# Patient Record
Sex: Female | Born: 1985
Health system: Southern US, Community
[De-identification: ages and names within clinical notes are randomized; demographics above are authoritative.]

## PROBLEM LIST (undated history)

## (undated) HISTORY — PX: WISDOM TOOTH EXTRACTION: SHX21

---

## 2017-12-22 ENCOUNTER — Ambulatory Visit (INDEPENDENT_AMBULATORY_CARE_PROVIDER_SITE_OTHER): Payer: BLUE CROSS/BLUE SHIELD | Admitting: Medical

## 2017-12-22 ENCOUNTER — Encounter: Payer: Self-pay | Admitting: Medical

## 2017-12-22 VITALS — BP 122/62 | HR 66 | Temp 98.3°F | Ht 66.5 in | Wt 134.2 lb

## 2017-12-22 DIAGNOSIS — R05 Cough: Secondary | ICD-10-CM

## 2017-12-22 DIAGNOSIS — J3489 Other specified disorders of nose and nasal sinuses: Secondary | ICD-10-CM | POA: Diagnosis not present

## 2017-12-22 DIAGNOSIS — R059 Cough, unspecified: Secondary | ICD-10-CM

## 2017-12-22 DIAGNOSIS — J301 Allergic rhinitis due to pollen: Secondary | ICD-10-CM

## 2017-12-22 DIAGNOSIS — H9209 Otalgia, unspecified ear: Secondary | ICD-10-CM

## 2017-12-22 MED ORDER — AMOXICILLIN 875 MG PO TABS: 875.0000 mg | ORAL_TABLET | Freq: Two times a day (BID) | ORAL | 0 refills | Status: DC

## 2017-12-22 MED ORDER — BENZONATATE 200 MG PO CAPS: 200.0000 mg | ORAL_CAPSULE | Freq: Three times a day (TID) | ORAL | 0 refills | Status: DC | PRN

## 2017-12-22 NOTE — Patient Instructions (Signed)
Recommendations  Increase your water intake  Stop Claritin  Instead, begin either Zyrtec D or Allegra D daily for at least 10+ days  You can use Ibuprofen over the counter if needed for throat or ear pain or head pressure  You can use Tessalon Perles cough drops up to 3 times daily for cough suppression  Wash daily, consider nasal saline flush  If you have worsening symptoms in the next few days despite the above recommendation, for example ongoing ear pain, worse head pressure, fever, thicker green or yellow colored mucous, then begin course of Amoxicillin antibiotic    Using Saline Nose Drops with Bulb Syringe A bulb syringe is used to clear your nose. You may use it when you have a stuffy nose, nasal congestion, sinus pressure, or sneezing.   SALINE SOLUTION You can buy nose drops at your local drug store. You can also make nose drops yourself. Mix 1 cup of water with  teaspoon of salt. Stir. Store this mixture at room temperature. Make a new batch daily.  USE THE BULB IN COMBINATION WITH SALINE NOSE DROPS  Squeeze the air out of the bulb before suctioning the saline mixture.  While still squeezing the bulb flat, place the tip of the bulb into the saline mixture.  Let air come back into the bulb.  This will suction up the saline mixture.  Gently flush one nostril at a time.  Salt water nose drops will then moisten your  congested nose and loosen secretions before suctioning.  Use the bulb syringe as directed below to suction.  USING THE BULB SYRINGE TO SUCTION  While still squeezing the bulb flat, place the tip of the bulb into a nostril. Let air come back into the bulb. The suction will pull snot out of the nose and into the bulb.  Repeat on the other nostril.  Squeeze syringe several times into a tissue.  CLEANING THE BULB SYRINGE  Clean the bulb syringe every day with hot soapy water.  Clean the inside of the bulb by squeezing the bulb while the tip is in soapy  water.  Rinse by squeezing the bulb while the tip is in clean hot water.  Store the bulb with the tip side down on paper towel.  HOME CARE INSTRUCTIONS   Use saline nose drops often to keep the nose open and not stuffy.  Throw away used salt water. Make a new solution every time.  Do not use the same solution and dropper for another person  If you do not prefer to use nasal saline flush, other options include nasal saline spray or the [employer], both of which are available over the counter at your pharmacy.

## 2017-12-22 NOTE — Progress Notes (Signed)
Subjective: Chief Complaint  Patient presents with  . Allergies    ear pain,cough, throat drainge, phlemg    Here as a new patient.  She notes 15-16 days of symptoms.  Just developed a heavy cough.  She has had some ear pressure, hearing decreased, pain in right ear, recent mild sore throat, and bad cough last night.   symptoms improved in the day, but worse in the morning.  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   No fever.  Has felt some SOB.  No hx/o asthma, no hx/o allergy problems.  Has a dog and a cat.   Works at a [employer].  Non smoker.   Has tried some plain Claritin, and using some Theraflu at night.    Has lived in [place] since 2012, [place] prior.      No past medical history on file.  No current outpatient medications on file prior to visit.   No current facility-administered medications on file prior to visit.    ROS as in subjective   Objective: BP 122/62   Pulse 66   Temp 98.3 F (36.8 C) (Oral)   Ht 5' 6.5" (1.689 m)   Wt 134 lb 3.2 oz (60.9 kg)   SpO2 98%   BMI 21.34 kg/m   General appearance: alert, no distress, WD/WN, lean white female HEENT: normocephalic, mild conjunctiva injection, sclerae anicteric, TMs pearly, nares with mild intermittent turbinated edema, no discharge or erythema, pharynx with post nasal drainage Oral cavity: MMM, no lesions Neck: supple, no lymphadenopathy, no thyromegaly, no masses Heart: RRR, normal S1, S2, no murmurs Lungs: CTA bilaterally, no wheezes, rhonchi, or rales Pulses: 2+ symmetric, upper and lower extremities, normal cap refill No calve pain or asymmetry   Assessment: Encounter Diagnoses  Name Primary?  . Sinus pressure Yes  . Otalgia, unspecified laterality   . Allergic rhinitis due to pollen, unspecified seasonality   . Cough     Plan: Discussed concerns, symptoms.   May just be allergy related, but can't rule out early sinusitis.   Recommendations  Increase your water intake  Stop  Claritin  Instead, begin either Zyrtec D or Allegra D daily for at least 10+ days  You can use Ibuprofen over the counter if needed for throat or ear pain or head pressure  You can use Tessalon Perles cough drops up to 3 times daily for cough suppression  Wash daily, consider nasal saline flush  If you have worsening symptoms in the next few days despite the above recommendation, for example ongoing ear pain, worse head pressure, fever, thicker green or yellow colored mucous, then begin course of Amoxicillin antibiotic

## 2018-10-19 ENCOUNTER — Ambulatory Visit (INDEPENDENT_AMBULATORY_CARE_PROVIDER_SITE_OTHER): Payer: BLUE CROSS/BLUE SHIELD | Admitting: Medical

## 2018-10-19 ENCOUNTER — Encounter: Payer: Self-pay | Admitting: Medical

## 2018-10-19 VITALS — BP 110/66 | HR 60 | Temp 97.9°F | Resp 16 | Ht 68.0 in | Wt 137.4 lb

## 2018-10-19 DIAGNOSIS — W57XXXA Bitten or stung by nonvenomous insect and other nonvenomous arthropods, initial encounter: Secondary | ICD-10-CM

## 2018-10-19 DIAGNOSIS — R21 Rash and other nonspecific skin eruption: Secondary | ICD-10-CM | POA: Diagnosis not present

## 2018-10-19 DIAGNOSIS — L989 Disorder of the skin and subcutaneous tissue, unspecified: Secondary | ICD-10-CM | POA: Insufficient documentation

## 2018-10-19 MED ORDER — TRIAMCINOLONE ACETONIDE 0.1 % EX CREA: 1.0000 "application " | TOPICAL_CREAM | Freq: Two times a day (BID) | CUTANEOUS | 0 refills | Status: DC

## 2018-10-19 NOTE — Progress Notes (Signed)
  Subjective:     Patient ID: Christine Ortiz, female   DOB: 1986/02/16, 33 y.o.   MRN: [id number]  HPI Here for rash on the back of her right leg behind the knee.  She was bitten by a tick back in October 2019, removed the tick, never got sick, never had a target lesion, never had maculopapular rash, denies body aches, chills, fever, denies joint aches.  Feeling fine.  Her only concern is this red bump where the tick bit her has not completely resolved and he gets itchy from time to time particularly if she uses a heated blanket such as in the wintertime. No other aggravating or relieving factors. No other complaint.   Review of Systems     Objective:   Physical Exam  General: Well-developed well-nourished no acute distress, white female Behind the right knee in the popliteal area is a 3 mm raised papular erythematous lesion, not indurated, not warm, no other worrisome findings No obvious lymphadenopathy or other skin changes    Assessment:     Encounter Diagnoses  Name Primary?  . Tick bite, initial encounter Yes  . Rash        Plan:     We discussed her symptoms, exam findings, and no red flag issues today.  Begin cream below for the next 7 to 10 days.  If not completely resolved within 10 days let me know.  Reassured that no worrisome findings  Advise she return soon for physical and fasting labs for routine preventative care  Doy was seen today for tick bite.  Diagnoses and all orders for this visit:  Tick bite, initial encounter  Rash  Other orders -     triamcinolone cream (KENALOG) 0.1 %; Apply 1 application topically 2 (two) times daily.

## 2018-10-19 NOTE — Progress Notes (Signed)
error 

## 2019-04-08 LAB — HM PAP SMEAR: HM Pap smear: NEGATIVE

## 2019-06-09 ENCOUNTER — Other Ambulatory Visit: Payer: Self-pay

## 2019-06-09 ENCOUNTER — Encounter: Payer: Self-pay | Admitting: Medical

## 2019-06-09 ENCOUNTER — Ambulatory Visit (INDEPENDENT_AMBULATORY_CARE_PROVIDER_SITE_OTHER): Payer: BC Managed Care – PPO | Admitting: Medical

## 2019-06-09 VITALS — BP 120/72 | HR 61 | Temp 97.8°F | Ht 68.0 in | Wt 137.4 lb

## 2019-06-09 DIAGNOSIS — R196 Halitosis: Secondary | ICD-10-CM | POA: Diagnosis not present

## 2019-06-09 DIAGNOSIS — J3489 Other specified disorders of nose and nasal sinuses: Secondary | ICD-10-CM | POA: Diagnosis not present

## 2019-06-09 DIAGNOSIS — Z0000 Encounter for general adult medical examination without abnormal findings: Secondary | ICD-10-CM | POA: Diagnosis not present

## 2019-06-09 DIAGNOSIS — Z113 Encounter for screening for infections with a predominantly sexual mode of transmission: Secondary | ICD-10-CM | POA: Insufficient documentation

## 2019-06-09 DIAGNOSIS — F339 Major depressive disorder, recurrent, unspecified: Secondary | ICD-10-CM | POA: Insufficient documentation

## 2019-06-09 DIAGNOSIS — Z23 Encounter for immunization: Secondary | ICD-10-CM | POA: Diagnosis not present

## 2019-06-09 DIAGNOSIS — H9203 Otalgia, bilateral: Secondary | ICD-10-CM | POA: Diagnosis not present

## 2019-06-09 DIAGNOSIS — L989 Disorder of the skin and subcutaneous tissue, unspecified: Secondary | ICD-10-CM

## 2019-06-09 DIAGNOSIS — Z638 Other specified problems related to primary support group: Secondary | ICD-10-CM

## 2019-06-09 MED ORDER — AMOXICILLIN 875 MG PO TABS: 875.0000 mg | ORAL_TABLET | Freq: Two times a day (BID) | ORAL | 0 refills | Status: DC

## 2019-06-09 MED ORDER — FLUOXETINE HCL 20 MG PO TABS: 20.0000 mg | ORAL_TABLET | Freq: Every day | ORAL | 1 refills | Status: DC

## 2019-06-09 NOTE — Patient Instructions (Signed)
RESOURCES in [place], [place]  If you are experiencing a mental health crisis or an emergency, please call 911 or go to the nearest emergency department.  [facility]   [phone] [facility]  [phone] [facility]   [phone]  Suicide Hotline 1-800-Suicide (1-800-784-2433)  National Suicide Prevention Lifeline 1-800-273-TALK  (1-800-273-8255)  Domestic Violence, Rape/Crisis - [facility] 336-273-7273  The National Domestic Violence Hotline 1-800-799-SAFE (1-800-799-7233)  To report Child or Elder Abuse, please call: [place] Police Department  [phone] [place] Sherriff Department  [phone]  LGBT Youth Crisis Line 1-866-488-7386  Teen Crisis line 336-387-6161 or 1-877-332-7333     Psychiatry and Counseling services  [facility] [address], [place], [place] [address] [phone]  Holly Ingram, therapist Dr. Carey Cottle, psychiatrist Dr. Glenn Jennings, child psychiatrist   Dr. Bartosz Luginbill Fuller [address], [place], [place] [address] [phone]   Dr. Rupinder Kaur, psychiatry [address], [place], [place] [address] [phone]   [facility] [address], [place], [place] [address] [phone]   [facility] [address], [place], [place] [address] [phone]    Counseling Services (NON- psychiatrist offices)  [facility] [address], [place], [place] [address] [phone]   [facility] [phone] [address], [place], [place] [address]   [facility] [phone]  [email or web address] [address], [place], [place] [address]   Merrianne M. Leff, therapist [phone] [address] [place], [place] [address]   [facility] [phone] [address], [place], [place]  [address]   Jill White-Huffman, therapist [phone] [address], [place], [place] [address]   [facility] [phone] [address], [place], [place] [address]   

## 2019-06-09 NOTE — Progress Notes (Signed)
Subjective:   HPI  Christine Ortiz is a 33 y.o. female who presents for Chief Complaint  Patient presents with  . Annual Exam    Patient Care Team: Tysinger, Camelia Eng, PA-C as PCP - General (Family Medicine) Sees dentist Sees eye doctor Dr. Genia Harold, [facility] Was seeing therapist in the past  Concerns: Hasn't had physical in numerous years other than yearly gyn follow up.  She notes some bilat ear pain for a month.  Worse after cleaning ears with qtips.  No drainage. Sometimes has allergies, but mild sinus pressure.  Has hx/o lots of ear infections as a child  Wants flu shot, td vaccine.  Has mole on left breast she is concerned about  Having a breath issue in past year, tonsil stones.  Has concerns about depression, fatigue. This year has gotten a lot worse. Started before she got married.  Been a roller coaster of a year.     Depression has been a theme since adolescence.   Has had some highs/lows.  Sometimes slumps quite low, but then will rebound.  Can go periods of things seemingly ok, not just ecstatic and no hx/o mania, but can then revert into period of depression.   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    Has some work stress  One of her major stressors is poor relationship in family since adolescence.   Her parents divorced when she was 65yo.   She to this day can't seem to have a good relationship with her father.   Her PGM blames her for not nurturing this relationship although father is an alcoholic.   Her mother and her had a recent big problem when she was planning her wedding.  Got married this past year.   Thus, she doesn't have good relationships with parents and even some grandparents.  Her best person of encouragement as a child was a friend's mom.    Her brother who is 65yo also doesn't get along with their parents. Mom started medication for ADD recently but she knows of no other mental health  issues in the family.   She was seeing recent therapist for a few months but insurance caused change in this.  She has seen therapies in the past as well.   No prior medication.  No other aggravating or relieving factors. No other complaint.   Reviewed their medical, surgical, family, social, medication, and allergy history and updated chart as appropriate.  History reviewed. No pertinent past medical history.  Past Surgical History:  Procedure Laterality Date  . WISDOM TOOTH EXTRACTION      Social History   Socioeconomic History  . Marital status: Single    Spouse name: Not on file  . Number of children: Not on file  . Years of education: Not on file  . Highest education level: Not on file  Occupational History  . Not on file  Social Needs  . Financial resource strain: Not on file  . Food insecurity    Worry: Not on file    Inability: Not on file  . Transportation needs    Medical: Not on file    Non-medical: Not on file  Tobacco Use  . Smoking status: Former Smoker    Packs/day: 0.25    Years: 10.00    Pack years: 2.50    Quit date: 02/07/2012    Years since quitting: 7.3  . Smokeless tobacco: Never Used  Substance and Sexual Activity  . Alcohol use: Yes    Comment: rarely  . Drug use: Never  . Sexual activity: Not on file  Lifestyle  . Physical activity    Days per week: Not on file    Minutes per session: Not on file  . Stress: Not on file  Relationships  . Social Musician on phone: Not on file    Gets together: Not on file    Attends religious service: Not on file    Active member of club or organization: Not on file    Attends meetings of clubs or organizations: Not on file    Relationship status: Not on file  . Intimate partner violence    Fear of current or ex partner: Not on file    Emotionally abused: Not on file    Physically abused: Not on file    Forced sexual activity: Not on file  Other Topics Concern  . Not on file  Social  History Narrative   Lives with husband, 2 dogs and a cat.   Working a Engineer, technical sales clinic, [employer].   Dog walking for exercise     Family History  Problem Relation Age of Onset  . Stroke Maternal Grandmother   . Cancer Maternal Grandfather        lung, smoker  . Heart disease Neg Hx   . Diabetes Neg Hx   . Hypertension Neg Hx      Current Outpatient Medications:  .  amoxicillin (AMOXIL) 875 MG tablet, Take 1 tablet (875 mg total) by mouth 2 (two) times daily., Disp: 20 tablet, Rfl: 0 .  FLUoxetine (PROZAC) 20 MG tablet, Take 1 tablet (20 mg total) by mouth daily., Disp: 30 tablet, Rfl: 1 .  triamcinolone cream (KENALOG) 0.1 %, Apply 1 application topically 2 (two) times daily. (Patient not taking: Reported on 06/09/2019), Disp: 30 g, Rfl: 0  Allergies  Allergen Reactions  . Rosemary Oil     Itching and throat tightness      Review of Systems Constitutional: -fever, -chills, -sweats, -unexpected weight change, -decreased appetite, -fatigue Allergy: -sneezing, -itching, -congestion Dermatology: +changing moles, --rash, -lumps ENT: -runny nose, +ear pain, -sore throat, -hoarseness, +sinus pain, -teeth pain, - ringing in ears, -hearing loss, -nosebleeds Cardiology: -chest pain, -palpitations, -swelling, -difficulty breathing when lying flat, -waking up short of breath Respiratory: -cough, -shortness of breath, -difficulty breathing with exercise or exertion, -wheezing, -coughing up blood Gastroenterology: -abdominal pain, -nausea, -vomiting, -diarrhea, -constipation, -blood in stool, -changes in bowel movement, -difficulty swallowing or eating Hematology: -bleeding, -bruising  Musculoskeletal: -joint aches, -muscle aches, -joint swelling, -back pain, -neck pain, -cramping, -changes in gait Ophthalmology: denies vision changes, eye redness, itching, discharge Urology: -burning with urination, -difficulty urinating, -blood in urine, -urinary frequency, -urgency,  -incontinence Neurology: -headache, -weakness, -tingling, -numbness, -memory loss, -falls, -dizziness Psychology: +depressed mood, -agitation, -sleep problems Breast/gyn: -breast tenderness, -discharge, -lumps, -vaginal discharge,- irregular periods, -heavy periods     Objective:  BP 120/72   Pulse 61   Temp 97.8 F (36.6 C)   Ht 5\' 8"  (1.727 m)   Wt 137 lb 6.4 oz (62.3 kg)   SpO2 99%   BMI 20.89 kg/m   General appearance: alert, no distress, WD/WN, Caucasian female Skin: scattered macules.  Left lower back with 4-5 mm diameter brown lesion with somewhat smudged borders, left medial breast at 8 o'clock with somewhat rectangular 0.5 cm x 0.47m brownish flat lesion, changes in the  last 2 months per patient.   other benign appearing macules.  HEENT: normocephalic, conjunctiva/corneas normal, sclerae anicteric, PERRLA, EOMi, nares patent, no discharge or erythema, pharynx normal Oral cavity: MMM, tongue normal, teeth normal, in good repair Neck: supple, no lymphadenopathy, no thyromegaly, no masses, normal ROM, no bruits Chest: non tender, normal shape and expansion Heart: RRR, normal S1, S2, no murmurs Lungs: CTA bilaterally, no wheezes, rhonchi, or rales Abdomen: +bs, soft, non tender, non distended, no masses, no hepatomegaly, no splenomegaly, no bruits Back: non tender, normal ROM, no scoliosis Musculoskeletal: upper extremities non tender, no obvious deformity, normal ROM throughout, lower extremities non tender, no obvious deformity, normal ROM throughout Extremities: no edema, no cyanosis, no clubbing Pulses: 2+ symmetric, upper and lower extremities, normal cap refill Neurological: alert, oriented x 3, CN2-12 intact, strength normal upper extremities and lower extremities, sensation normal throughout, DTRs 2+ throughout, no cerebellar signs, gait normal Psychiatric: normal affect, behavior normal, pleasant  Breast/gyn/rectal - deferred to gynecology     Assessment and Plan :    Encounter Diagnoses  Name Primary?  . Encounter for health maintenance examination in adult Yes  . Otalgia of both ears   . Sinus pressure   . Skin lesion   . Halitosis   . Depression, recurrent (HCC)   . Stress due to family tension   . Screen for STD (sexually transmitted disease)      Physical exam - discussed and counseled on healthy lifestyle, diet, exercise, preventative care, vaccinations, sick and well care, proper use of emergency dept and after hours care, and addressed their concerns.    Health screening: Advised they see their eye doctor yearly for routine vision care. Advised they see their dentist yearly for routine dental care including hygiene visits twice yearly.  Discussed STD testing   Cancer screening Counseled on self breast exams, mammograms, cervical cancer screening  Vaccinations: Advised yearly influenza vaccine Counseled on the influenza virus vaccine.  Vaccine information sheet given.  Influenza vaccine given after consent obtained.  Counseled on the Tdap (tetanus, diptheria, and acellular pertussis) vaccine.  Vaccine information sheet given. Tdap vaccine given after consent obtained.   Separate significant issues discussed: Her main issue today is what seems to be pretty severe depression.  I advise she get a counseling right away.  She was seeing a counselor regular until her insurance forced to change in providers.  I gave her a list of the area.  She is agreeable to medication so we will start trial of fluoxetine.  We discussed risk and benefits of medication proper use of medication.  We discussed ways to move forward and deal with some of the issues she discussed.  I advised to follow-up in 2 to 3 weeks with me.  Ear pain, possibly related to eustachian tube dysfunction/sinus infection.  Begin oral amoxicillin and see if this clears it up as there is no other obvious cause for her ear pain today  Halitosis-advise she get in with a dentist soon  as she has not seen a dentist in 3 years.  Also discussed acid reflux and sinus infection and other things that can contribute to bad breath.  She does eat a lot of spicy foods and we discussed possibly cutting back for now to see if this helps her symptoms  She will return next week for fasting labs   Christine LeatherwoodKatherine was seen today for annual exam.  Diagnoses and all orders for this visit:  Encounter for health maintenance examination in adult -  Comprehensive metabolic panel; Future -     CBC with Differential/Platelet; Future -     Lipid panel; Future -     TSH; Future -     HIV Antibody (routine testing w rflx); Future -     RPR; Future  Otalgia of both ears  Sinus pressure  Skin lesion -     Ambulatory referral to Dermatology  Halitosis  Depression, recurrent (HCC)  Stress due to family tension  Screen for STD (sexually transmitted disease) -     HIV Antibody (routine testing w rflx); Future -     RPR; Future  Other orders -     amoxicillin (AMOXIL) 875 MG tablet; Take 1 tablet (875 mg total) by mouth 2 (two) times daily. -     FLUoxetine (PROZAC) 20 MG tablet; Take 1 tablet (20 mg total) by mouth daily.   Spent greater than 45 minutes specifically on depression and her mood.  Follow-up pending labs, yearly for physical

## 2019-06-13 NOTE — Addendum Note (Signed)
Addended by: Edgar Frisk on: 06/13/2019 10:55 AM   Modules accepted: Orders

## 2019-06-17 ENCOUNTER — Telehealth: Payer: Self-pay | Admitting: Medical

## 2019-06-17 NOTE — Telephone Encounter (Signed)
Received requested records from [facility] 

## 2019-06-21 ENCOUNTER — Encounter: Payer: Self-pay | Admitting: Medical

## 2019-07-01 ENCOUNTER — Telehealth: Payer: Self-pay

## 2019-07-01 NOTE — Telephone Encounter (Signed)
Pt. Called questions about new medication that was started recently Fluoxetine 20 mg 1 daily, at first was taking in the morning mood was good but then after 3 days started waking up in the middle of night, so then switched to taking QHS slept better then but mood was worse through out the day, pt. Just wanted to know when she should take the medication, or if she also needed something for sleep or did the dose need to be increased. Pt. Aware we are closing soon and you would not get back to her today.

## 2019-07-04 ENCOUNTER — Encounter: Payer: Self-pay | Admitting: Medical

## 2019-07-04 ENCOUNTER — Other Ambulatory Visit: Payer: Self-pay

## 2019-07-04 ENCOUNTER — Ambulatory Visit (INDEPENDENT_AMBULATORY_CARE_PROVIDER_SITE_OTHER): Payer: BC Managed Care – PPO | Admitting: Medical

## 2019-07-04 VITALS — Ht 67.0 in | Wt 137.0 lb

## 2019-07-04 DIAGNOSIS — G479 Sleep disorder, unspecified: Secondary | ICD-10-CM | POA: Diagnosis not present

## 2019-07-04 DIAGNOSIS — Z638 Other specified problems related to primary support group: Secondary | ICD-10-CM | POA: Diagnosis not present

## 2019-07-04 DIAGNOSIS — F339 Major depressive disorder, recurrent, unspecified: Secondary | ICD-10-CM

## 2019-07-04 MED ORDER — FLUOXETINE HCL 10 MG PO CAPS: 10.0000 mg | ORAL_CAPSULE | Freq: Every day | ORAL | 1 refills | Status: DC

## 2019-07-04 NOTE — Telephone Encounter (Signed)
I called and left message.   Go ahead and set up for virtual consult today.

## 2019-07-04 NOTE — Telephone Encounter (Signed)
Called LM with pt. To call back for a virtual apt.

## 2019-07-04 NOTE — Progress Notes (Signed)
this visit type was conducted due to national recommendations for restrictions regarding the COVID-19 Pandemic (e.g. social distancing) in an effort to limit this patient's exposure and mitigate transmission in our community.  Due to their co-morbid illnesses, this patient is at least at moderate risk for complications without adequate follow up.  This format is felt to be most appropriate for this patient at this time.    Documentation for virtual audio and video telecommunications through Zoom encounter:  The patient was located at home. The provider was located in the office. The patient did consent to this visit and is aware of possible charges through their insurance for this visit.  The other persons participating in this telemedicine service were none. Time spent on call was  15 minutes and in review of previous records > 15 minutes total.  This virtual service is not related to other E/M service within previous 7 days.  Subjective: Chief Complaint  Patient presents with  . Consult   Virtual consult today to follow-up on medication started last visit her physical.  Last visit we started fluoxetine 20 mg to help with longstanding depression and mood issues.  So far even in the first week she felt immediate improvements in mood and depression.  However when she takes the medication in the day her mood is good but she does not sleep very well.  And after several days on it it from a friend she started taking the medication at bedtime.  Changing the medication to bedtime to help with sleep but then it did not help as much with her mood.  She notices significant difference.  She changed the medication back to morning time dosing.  Overall being on this medication she feels like she has better control over her emotions.  She feels like she compares to controlling a horse versus a horse trampling over her.  Since last visit on the medication, she is not getting is irritable or stressed over  things that make her more stressed.  Her friends and her husband have taken notice of this.  She can tell that she is more focused, handling things better overall compared to a month ago.  She has her new appointment with counseling coming up soon.  She was seeing a different counselor prior until her insurance changed.  Since last visit she also has been doing more group activity, more social which has also been helping.  Since last visit she saw dermatology and had a biopsy of the skin lesion of her left breast.  No other new complaints.  See last visit for further details on history regarding depression.    Objective: Ht 5\' 7"  (1.702 m)   Wt 137 lb (62.1 kg)   BMI 21.46 kg/m   Not examined in person as this was a virtual consult  Assessment: Encounter Diagnoses  Name Primary?  . Depression, recurrent ([place]) Yes  . Stress due to family tension   . Sleep disturbance      Plan: Glad to hear she sees improvements in mood.  We will decrease dose of fluoxetine to see if this helps both sleep and continues to help with mood.  Continue plan to establish with a new counselor.  I asked her to follow-up within 1 to 2 weeks to give me an update on symptoms.  Christine Ortiz was seen today for consult.  Diagnoses and all orders for this visit:  Depression, recurrent ([place])  Stress due to family tension  Sleep disturbance  Other orders -  FLUoxetine (PROZAC) 10 MG capsule; Take 1 capsule (10 mg total) by mouth daily.

## 2019-08-05 ENCOUNTER — Encounter: Payer: Self-pay | Admitting: Medical

## 2019-08-05 ENCOUNTER — Other Ambulatory Visit: Payer: Self-pay

## 2019-08-22 ENCOUNTER — Other Ambulatory Visit: Payer: Self-pay | Admitting: Medical

## 2019-11-24 ENCOUNTER — Other Ambulatory Visit: Payer: Self-pay | Admitting: Medical

## 2019-11-24 NOTE — Telephone Encounter (Signed)
Sent patient a message requesting a call for appointment.

## 2019-11-30 ENCOUNTER — Other Ambulatory Visit: Payer: Self-pay

## 2019-11-30 ENCOUNTER — Ambulatory Visit (INDEPENDENT_AMBULATORY_CARE_PROVIDER_SITE_OTHER): Payer: BC Managed Care – PPO | Admitting: Medical

## 2019-11-30 ENCOUNTER — Encounter: Payer: Self-pay | Admitting: Medical

## 2019-11-30 VITALS — Temp 97.8°F | Ht 68.0 in | Wt 134.0 lb

## 2019-11-30 DIAGNOSIS — F325 Major depressive disorder, single episode, in full remission: Secondary | ICD-10-CM

## 2019-11-30 MED ORDER — FLUOXETINE HCL 20 MG PO CAPS: ORAL_CAPSULE | ORAL | 1 refills | Status: DC

## 2019-11-30 NOTE — Progress Notes (Signed)
  this visit type was conducted due to national recommendations for restrictions regarding the COVID-19 Pandemic (e.g. social distancing) in an effort to limit this patient's exposure and mitigate transmission in our community.  Due to their co-morbid illnesses, this patient is at least at moderate risk for complications without adequate follow up.  This format is felt to be most appropriate for this patient at this time.    Documentation for virtual audio and video telecommunications through Zoom encounter:  The patient was located at home. The provider was located in the office. The patient did consent to this visit and is aware of possible charges through their insurance for this visit.  The other persons participating in this telemedicine service were none. Time spent on call was  15 minutes and in review of previous records > 15 minutes total.  This virtual service is not related to other E/M service within previous 7 days.   Subjective: Chief Complaint  Patient presents with  . Follow-up    prozac-medication working fine per patient    Virtual consult today to follow-up on medication for mood.  She notes that her mood has been doing well.  Seeing a therapist every 2 weeks, Christine Ortiz.  We will be stopping counseling at this frequency soon and going to a less frequent sessions.   When she first started this medicine last year she felt like her emotions were like a horse cramping over her.  But now she feels like she has reigns on the horse.  The medication has been great to help stabilize her moods.  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  Is the best job she is ever had.  Otherwise doing well with no other particular complaints.   Sleep is ok.  Occasional wakes randomly.       Objective: Temp 97.8 F (36.6 C)   Ht 5\' 8"  (1.727 m)   Wt 134 lb (60.8 kg)   BMI 20.37 kg/m   Not examined in  person as this was a virtual consult     Assessment: Encounter Diagnosis  Name Primary?  . Depression, major, in remission ([facility]) Yes     Plan: Glad to hear she is doing much better and things have been going well for her.  She will continue counseling on a less frequent basis and every 2 weeks at this point.  She has a good stable job that she enjoys.  Discussed medication proper use.  Continue current medication.  Christine Ortiz was seen today for follow-up.  Diagnoses and all orders for this visit:  Depression, major, in remission (H[facility])  Other orders -     FLUoxetine (PROZAC) 20 MG capsule; TAKE 1 TABLET(20 MG) BY MOUTH DAILY  Follow-up in October for yearly physical

## 2019-12-07 ENCOUNTER — Other Ambulatory Visit: Payer: BC Managed Care – PPO

## 2019-12-21 ENCOUNTER — Other Ambulatory Visit: Payer: Self-pay

## 2019-12-21 ENCOUNTER — Encounter: Payer: Self-pay | Admitting: Medical

## 2019-12-21 ENCOUNTER — Ambulatory Visit (INDEPENDENT_AMBULATORY_CARE_PROVIDER_SITE_OTHER): Payer: BC Managed Care – PPO | Admitting: Medical

## 2019-12-21 VITALS — BP 120/78 | HR 68 | Temp 98.1°F | Ht 68.0 in | Wt 141.6 lb

## 2019-12-21 DIAGNOSIS — S93601A Unspecified sprain of right foot, initial encounter: Secondary | ICD-10-CM

## 2019-12-21 DIAGNOSIS — M79671 Pain in right foot: Secondary | ICD-10-CM | POA: Diagnosis not present

## 2019-12-21 DIAGNOSIS — M7741 Metatarsalgia, right foot: Secondary | ICD-10-CM | POA: Diagnosis not present

## 2019-12-21 NOTE — Patient Instructions (Signed)
Recommendations:   Use a combination of ice and heat therapy such as ice water bath in bucket for 10-15 minutes.   Then let foot warm up.   Then use warm water bath or hot towel for 10-15 minutes.  You can do heat/cool therapy twice daily  Begin Aleve over the counter, 2 tablets twice daily (440mg ) with food for at least 5 days.  Rest the foot when possible  Go get a cam walker or air cast boot from pharmacy to use daily for 10-14 days  Lets recheck in 10-14 days     [facility] [address] [place], [place], [place] [phone] phone

## 2019-12-21 NOTE — Progress Notes (Signed)
Chief Complaint  Patient presents with  . Foot Pain    right after mowing the lawn 2 weeks ago-worsened since then    Here for right foot pain.  She was fine until 2 weeks ago after mowing.  Her yard is almost 3 acres.   Recently she push mowed the bulk of the yard for about 2+ hours, doing a lot of pivoting and turning around trees, and was fine the evening of mowing.  However, the next morning awoke with right foot pain in ball of the foot, and along 4th and 5th toes along side of foot.   Denies other injury trauma, or fall.     Has pain with weight bearing primarily.   No swelling.   No bruising.    Has tried ice, heat, aleve a few times.  No other aggravating or relieving factors. No other complaint.  No past medical history on file.  Current Outpatient Medications on File Prior to Visit  Medication Sig Dispense Refill  . FLUoxetine (PROZAC) 20 MG capsule TAKE 1 TABLET(20 MG) BY MOUTH DAILY 90 capsule 1   No current facility-administered medications on file prior to visit.   ROS as in subjective    Objective: BP 120/78   Pulse 68   Temp 98.1 F (36.7 C)   Ht 5\' 8"  (1.727 m)   Wt 141 lb 9.6 oz (64.2 kg)   SpO2 98%   BMI 21.53 kg/m   Gen: wd, wn, nad Right foot without obvious deformity, normal toe and ankle range of motion, nontender to palpation, but she notes pain when standing on the foot and walking.  Arches are relatively good.  Knee and hip nontender, rest of leg nontender, normal range of motion     Assessment: Encounter Diagnoses  Name Primary?  . Right foot pain Yes  . Metatarsalgia of right foot   . Sprain of foot joint, right, initial encounter       Plan: Symptoms and exam suggests sprain strain injury from overuse from mowing and doing a lot of pivoting.  We discussed the following recommendations  Patient Instructions  Recommendations:   Use a combination of ice and heat therapy such as ice water bath in bucket for 10-15 minutes.   Then let foot  warm up.   Then use warm water bath or hot towel for 10-15 minutes.  You can do heat/cool therapy twice daily  Begin Aleve over the counter, 2 tablets twice daily (440mg ) with food for at least 5 days.  Rest the foot when possible  Go get a cam walker or air cast boot from pharmacy to use daily for 10-14 days  Lets recheck in 10-14 days     [facility] [address] [place], [address] [place] [phone] phone    Sheridyn was seen today for foot pain.  Diagnoses and all orders for this visit:  Right foot pain  Metatarsalgia of right foot  Sprain of foot joint, right, initial encounter   Follow-up in 10 to 14 days

## 2020-01-02 ENCOUNTER — Ambulatory Visit: Payer: BC Managed Care – PPO | Admitting: Medical

## 2020-01-09 ENCOUNTER — Ambulatory Visit (INDEPENDENT_AMBULATORY_CARE_PROVIDER_SITE_OTHER): Payer: BC Managed Care – PPO | Admitting: Medical

## 2020-01-09 ENCOUNTER — Encounter: Payer: Self-pay | Admitting: Medical

## 2020-01-09 ENCOUNTER — Ambulatory Visit
Admission: RE | Admit: 2020-01-09 | Discharge: 2020-01-09 | Disposition: A | Discharge status: Home / Self Care | Payer: BC Managed Care – PPO | Source: Ambulatory Visit | Attending: Medical | Admitting: Medical

## 2020-01-09 ENCOUNTER — Other Ambulatory Visit: Payer: Self-pay

## 2020-01-09 VITALS — BP 118/80 | HR 62 | Temp 98.0°F | Wt 142.8 lb

## 2020-01-09 DIAGNOSIS — M79671 Pain in right foot: Secondary | ICD-10-CM

## 2020-01-09 NOTE — Progress Notes (Signed)
Chief Complaint  Patient presents with  . other    f/u on rt. foot has not going away yet    Here for recheck I saw her 2-1/2 weeks ago for injury.  She has been using cam walker daily, ice, relative rest.  She definitely notes improvement but not fully back to normal.  She still gets some pain weightbearing without the boot.     From last visit's history, she started having foot pain about a month ago the start of her right after mowing 3 acres of her yard push mowing.  She push mowed the bulk of the yard for about 2+ hours, doing a lot of pivoting and turning around trees, and was fine the evening of mowing.  However, the next morning awoke with right foot pain in ball of the foot, and along 4th and 5th toes along side of foot.   Denies other injury trauma, or fall.   No other aggravating or relieving factors.  No other complaint.  No past medical history on file.  Current Outpatient Medications on File Prior to Visit  Medication Sig Dispense Refill  . FLUoxetine (PROZAC) 20 MG capsule TAKE 1 TABLET(20 MG) BY MOUTH DAILY 90 capsule 1   No current facility-administered medications on file prior to visit.   ROS as in subjective   Objective: BP 118/80   Pulse 62   Temp 98 F (36.7 C)   Wt 142 lb 12.8 oz (64.8 kg)   LMP  (LMP Unknown)   BMI 21.71 kg/m   Gen: wd, wn, nad Right foot without obvious deformity, normal toe and ankle range of motion.  Tender over right foot along second and third metatarsal distally, tender over MTP of second and third toes, otherwise nontender, no swelling no deformity.   pain when standing on the foot and walking.  Arches are relatively good.   Knee and hip nontender, rest of leg nontender, normal range of motion Feet neurovascularly intact   Assessment: Encounter Diagnosis  Name Primary?  Marland Kitchen Foot pain, right Yes      Plan: She has used the cam walker for 2.5 weeks.  Although she has seen improvement we will send for x-ray today.  I still  suspect this to be more of an overuse tendinitis injury but cannot rule out stress fracture or other fracture..    Continue CAM Walker and rest for now  Patient Instructions  Please go to [facility] for your foot xray.   Their hours are 8am - 4:30 pm Monday - Friday.  Take your insurance card with you.  [facility] [phone]  [address], [address] [place], [place] [address]  [address] [place], [place] [address]   Maurianna was seen today for other.  Diagnoses and all orders for this visit:  Foot pain, right -     DG Foot Complete Right; Future   Follow-up pending xray

## 2020-01-09 NOTE — Patient Instructions (Signed)
Please go to [facility] for your foot xray.   Their hours are 8am - 4:30 pm Monday - Friday.  Take your insurance card with you.  [facility] [phone]  [address], [address] [place], [place] [address]  [address] [place], [place] [address]

## 2020-06-02 ENCOUNTER — Other Ambulatory Visit: Payer: Self-pay | Admitting: Medical

## 2020-06-06 ENCOUNTER — Other Ambulatory Visit: Payer: Self-pay | Admitting: Medical

## 2020-06-13 ENCOUNTER — Other Ambulatory Visit: Payer: Self-pay

## 2020-06-13 ENCOUNTER — Other Ambulatory Visit: Payer: Self-pay | Admitting: *Deleted

## 2020-06-13 DIAGNOSIS — Z20822 Contact with and (suspected) exposure to covid-19: Secondary | ICD-10-CM

## 2020-06-14 LAB — SARS-COV-2, NAA 2 DAY TAT

## 2020-06-14 LAB — NOVEL CORONAVIRUS, NAA: SARS-CoV-2, NAA: NOT DETECTED

## 2020-06-14 LAB — SPECIMEN STATUS REPORT

## 2020-07-06 ENCOUNTER — Telehealth: Payer: BC Managed Care – PPO | Admitting: Medical

## 2020-07-06 ENCOUNTER — Encounter: Payer: Self-pay | Admitting: Medical

## 2020-07-06 VITALS — Temp 99.4°F | Ht 68.0 in | Wt 140.0 lb

## 2020-07-06 DIAGNOSIS — J029 Acute pharyngitis, unspecified: Secondary | ICD-10-CM

## 2020-07-06 DIAGNOSIS — J0300 Acute streptococcal tonsillitis, unspecified: Secondary | ICD-10-CM

## 2020-07-06 MED ORDER — AZITHROMYCIN 250 MG PO TABS: ORAL_TABLET | ORAL | 0 refills | Status: AC

## 2020-07-06 NOTE — Progress Notes (Signed)
  Subjective:     Patient ID: Christine Ortiz, female   DOB: Jan 06, 1986, 34 y.o.   MRN: [id number]  This visit type was conducted due to national recommendations for restrictions regarding the COVID-19 Pandemic (e.g. social distancing) in an effort to limit this patient's exposure and mitigate transmission in our community.  Due to their co-morbid illnesses, this patient is at least at moderate risk for complications without adequate follow up.  This format is felt to be most appropriate for this patient at this time.    Documentation for virtual audio and video telecommunications through [place] encounter:  The patient was located at home. The provider was located in the office. The patient did consent to this visit and is aware of possible charges through their insurance for this visit.  The other persons participating in this telemedicine service were none. Time spent on call was 20 minutes and in review of previous records 20 minutes total.  This virtual service is not related to other E/M service within previous 7 days.   HPI Chief Complaint  Patient presents with  . Sore Throat    x3 weeks was diagnosed with strep. having symptoms again-white patches   Got sick recently, low grade fever, and tonsils had white blotches.  Went to online to [email or web address].   Took photos of tonsils.  Diagnosed with strep.  Had script for Amoxicillin x 10 days.   Was improved, but this morning worse again after 10 days of amoxicillin.   Has had some body aches.  Has low grade fever.  No nausea, no vomiting . No swollen lymph nodes.   Awoke with headache.  Some nasal congestion mid day.   Had covid test 10/20 negative when symptoms began initially.  Has had both covid vaccine.  No mono contact.  No sharing drinks or kissing anybody other than husband. No other aggravating or relieving factors. No other complaint.   Review of Systems As in subjective    Objective:   Physical Exam Due to coronavirus  pandemic stay at home measures, patient visit was virtual and they were not examined in person.   Temp 99.4 F (37.4 C)   Ht 5\' 8"  (1.727 m)   Wt 140 lb (63.5 kg)   BMI 21.29 kg/m       Assessment:     Encounter Diagnoses  Name Primary?  . Sore throat Yes  . Strep tonsillitis        Plan:     Begin zpak, rest, hydrate, use salt water gargles, warm fluids, chloraseptic spray. If not improving or resolved within 4-5 days, then recheck including possible mono swab, CBC  Christine Ortiz was seen today for sore throat.  Diagnoses and all orders for this visit:  Sore throat  Strep tonsillitis  Other orders -     azithromycin (ZITHROMAX) 250 MG tablet; 2 tablets day 1, then 1 tablet days 2-4

## 2020-12-15 ENCOUNTER — Other Ambulatory Visit: Payer: Self-pay | Admitting: Medical

## 2020-12-17 NOTE — Telephone Encounter (Signed)
Schedule CPX and then can send refill

## 2020-12-17 NOTE — Telephone Encounter (Signed)
Patient advised that appointment needs to be scheduled before refill can be sent.

## 2021-02-27 IMAGING — DX DG FOOT COMPLETE 3+V*R*
3 series · 3 of 3 positions shown · non-contrast
Comparison: None.

CLINICAL DATA: Right foot pain for 4 weeks. Pain at the
metatarsals.

EXAM:
RIGHT FOOT COMPLETE - 3+ VIEW

[dg foot complete right (1 of 3)]
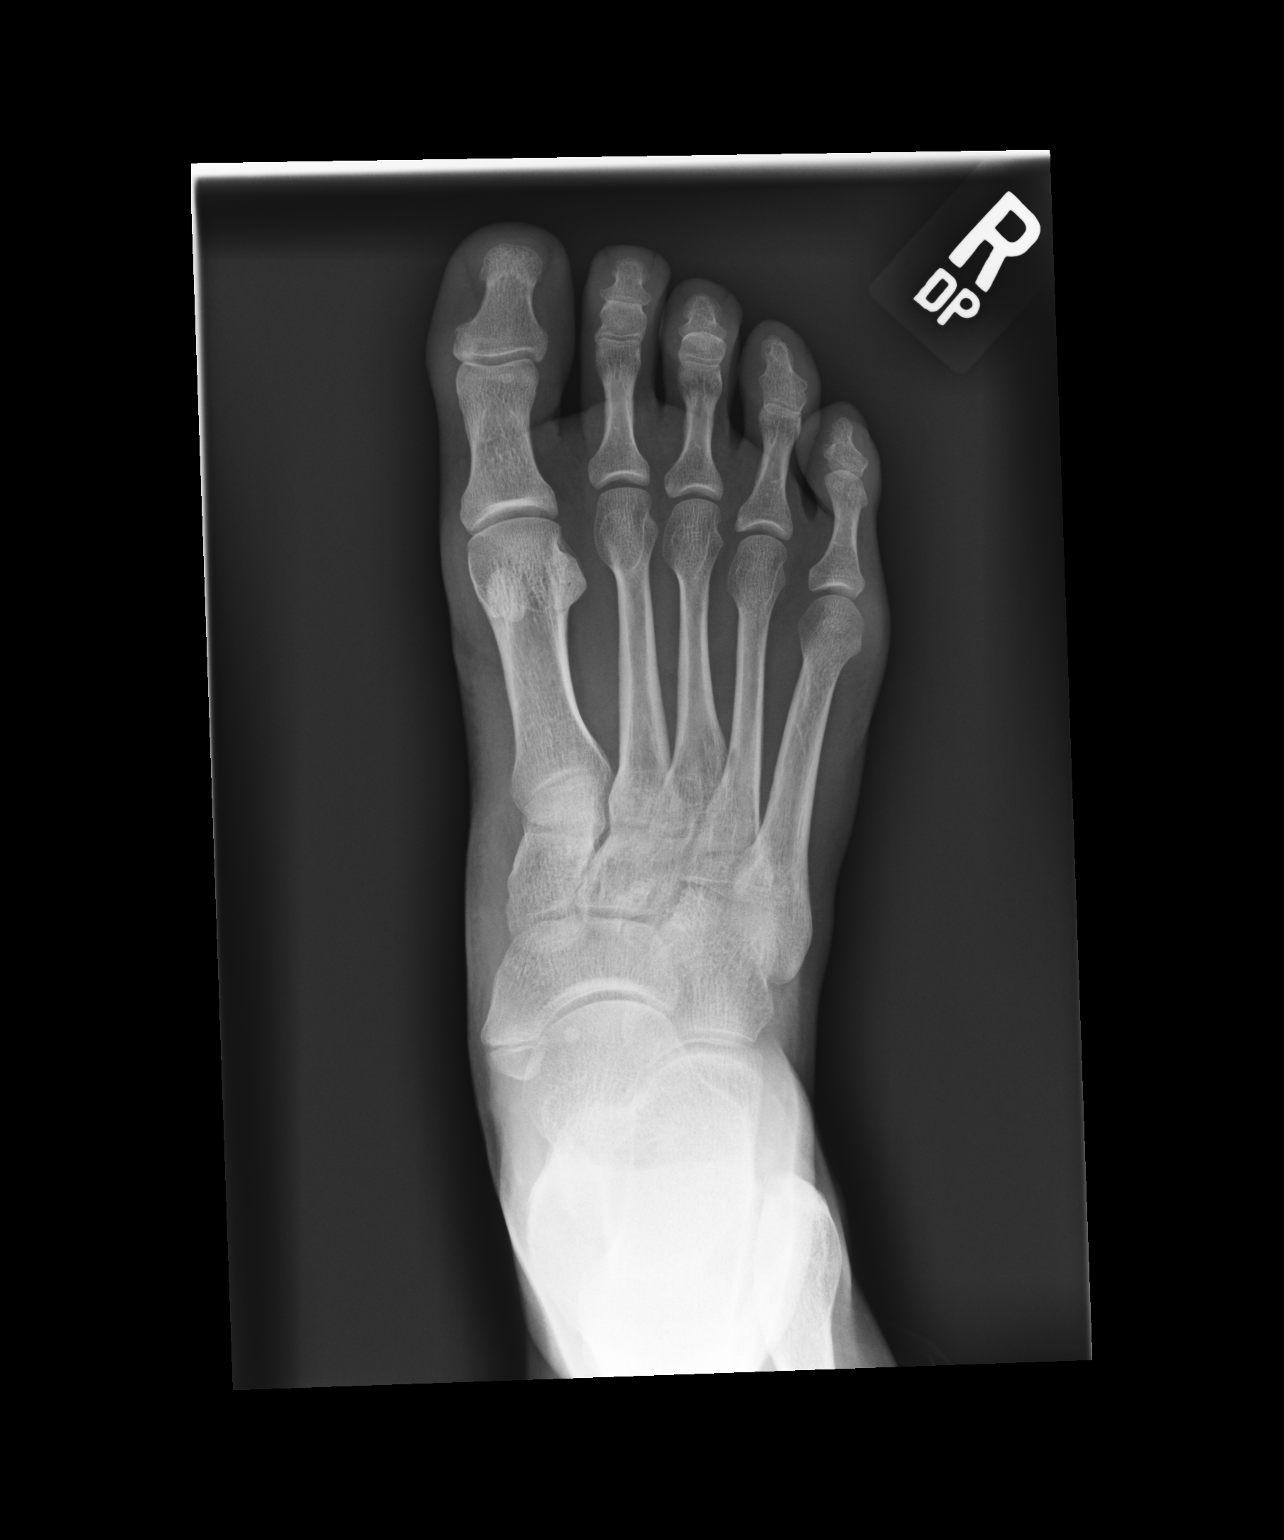

[dg foot complete right (2 of 3)]
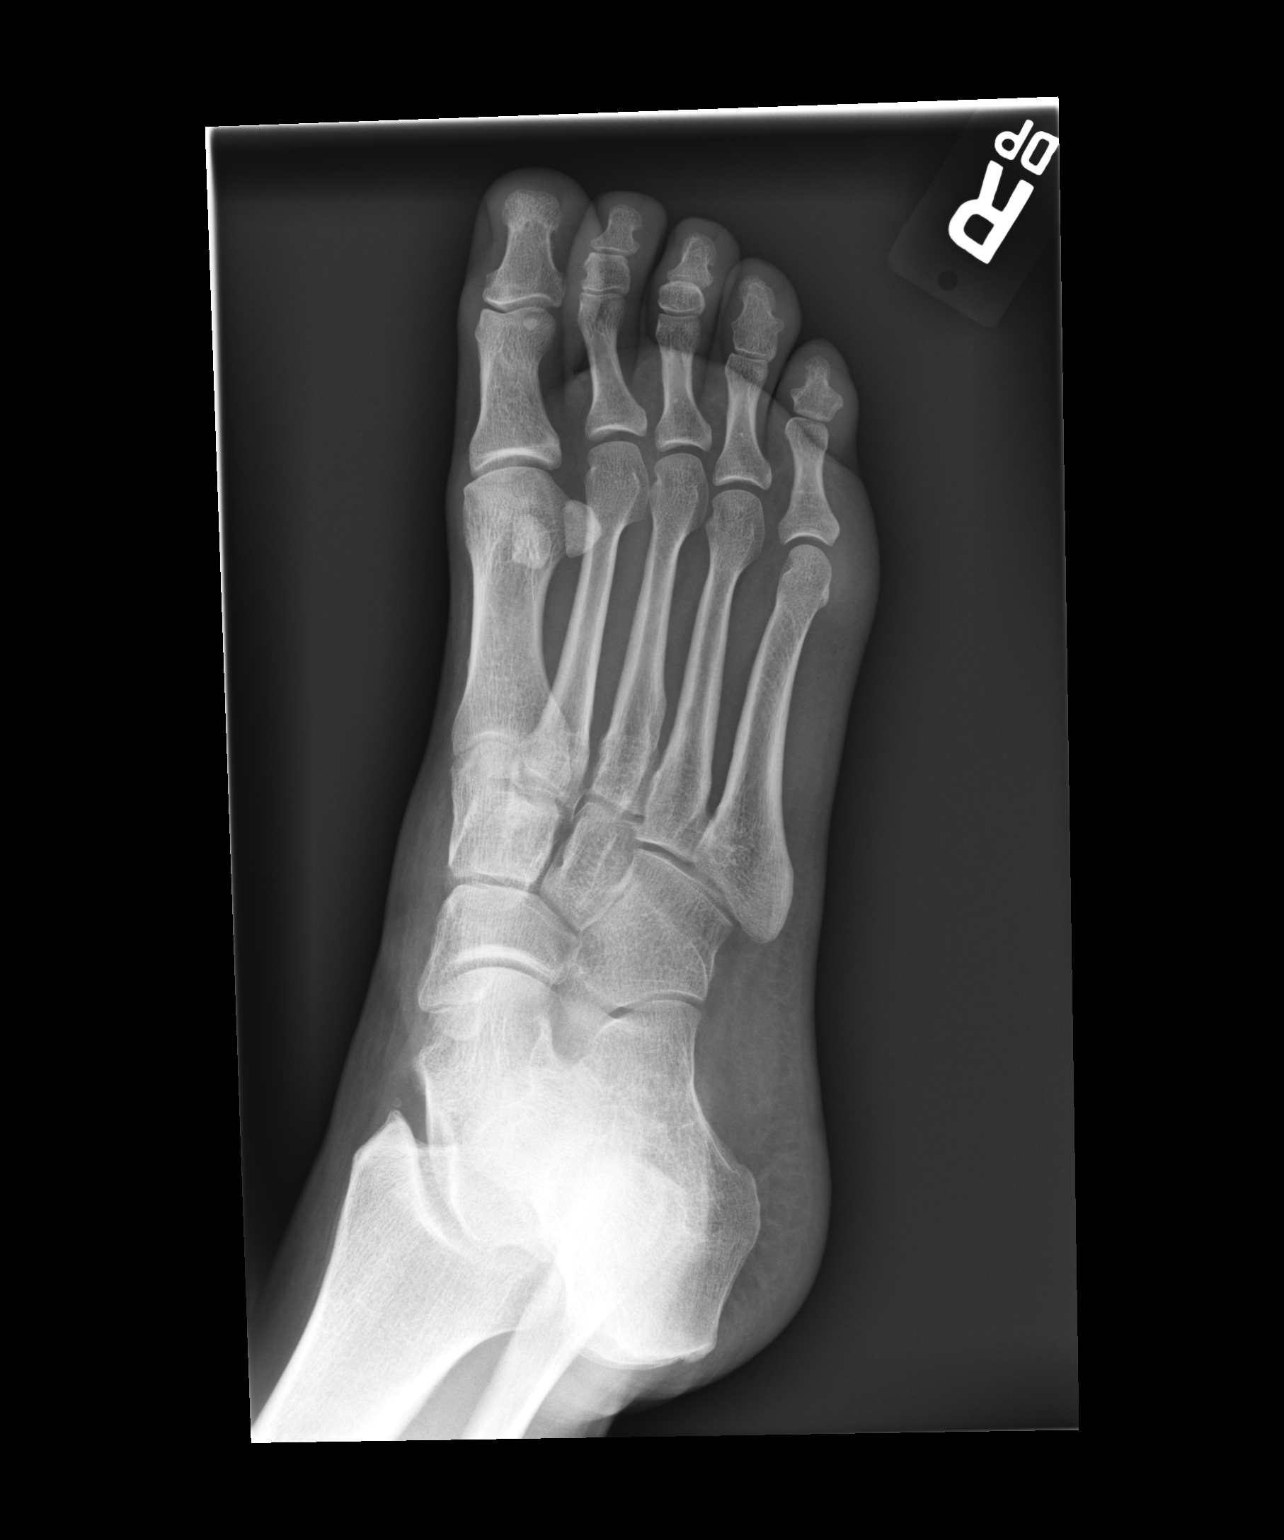

[dg foot complete right (3 of 3)]
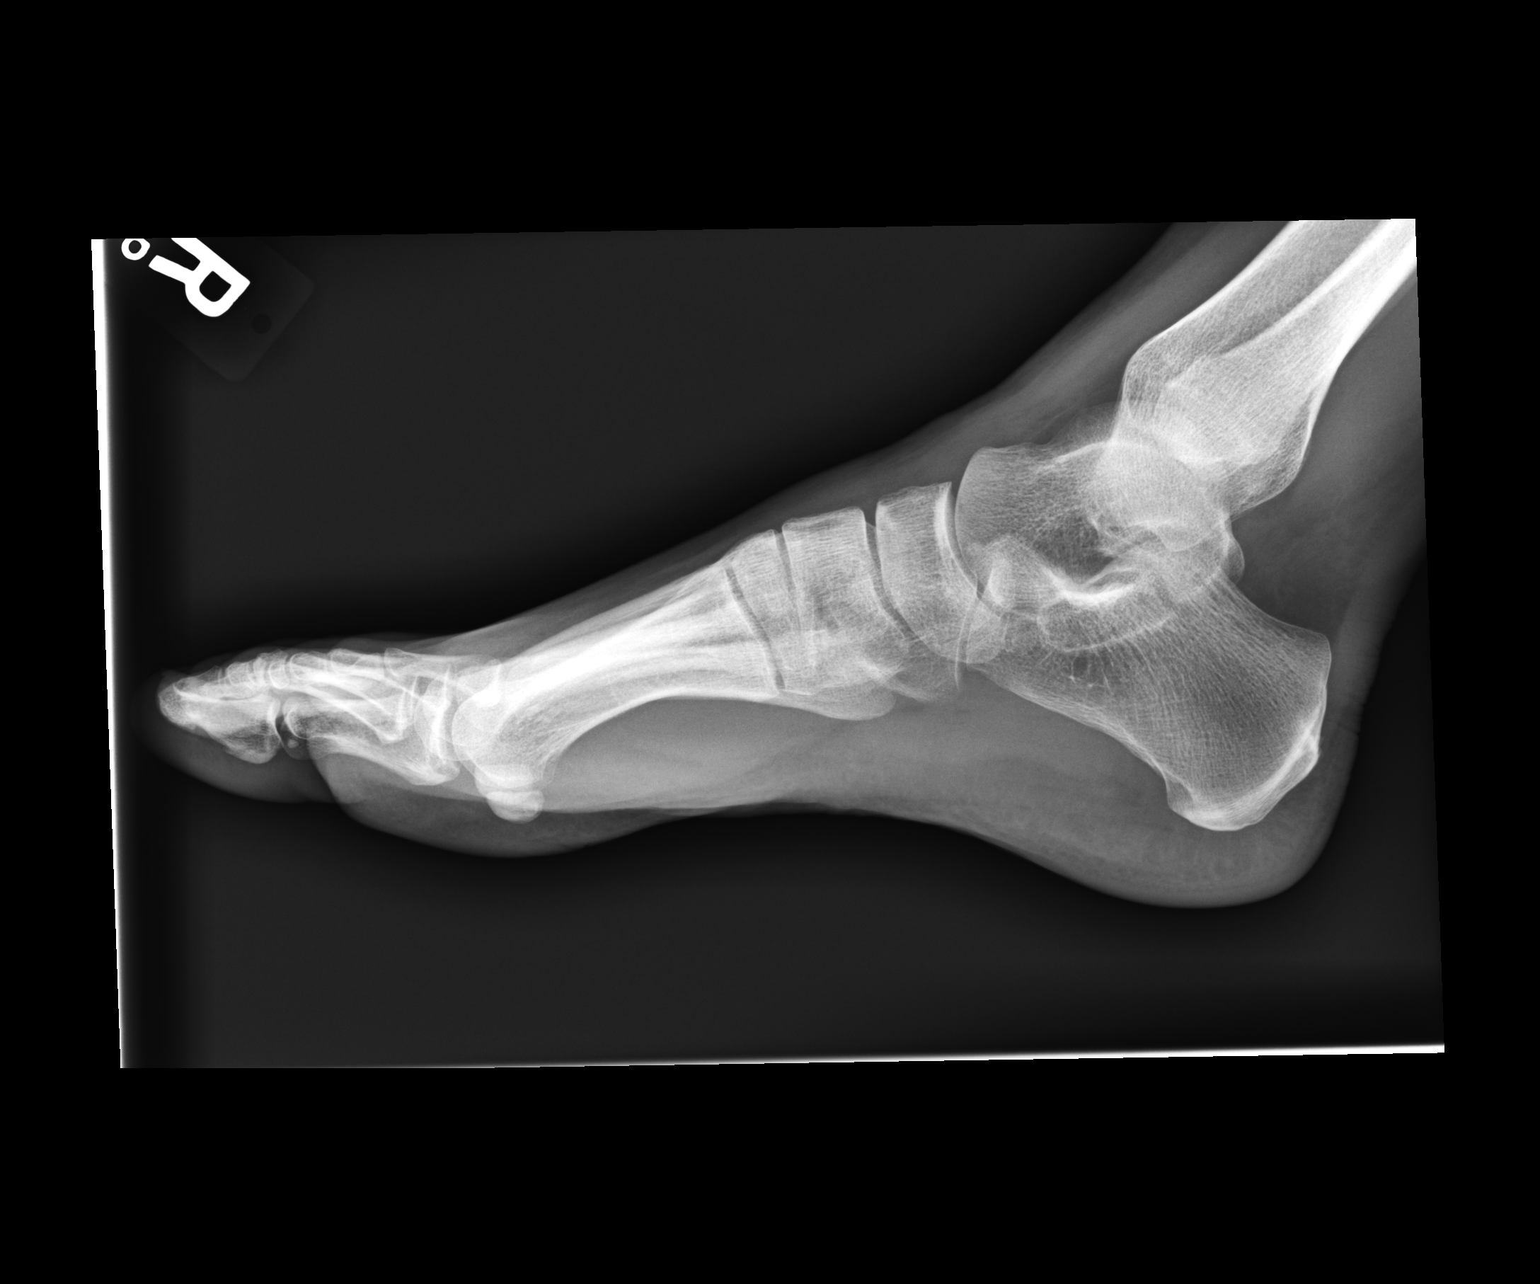

[3 of 3 positions shown; findings below may reference images not displayed]

FINDINGS: Negative for fracture or dislocation. Normal alignment in the right
foot. No focal soft tissue abnormality. No significant joint space
narrowing or arthropathy.
IMPRESSION: No acute abnormality to the right foot.

## 2021-10-21 ENCOUNTER — Telehealth: Payer: Self-pay | Admitting: Medical

## 2021-10-21 NOTE — Telephone Encounter (Signed)
Pt called and states she is no longer our pt, she is seeing someone that is closer to her job

## 2022-01-31 ENCOUNTER — Ambulatory Visit: Payer: Self-pay | Admitting: Family Medicine
